# Patient Record
Sex: Female | Born: 2000 | Race: White | Hispanic: No | Marital: Single | Smoking: Never smoker
Health system: Southern US, Community
[De-identification: ages and names within clinical notes are randomized; demographics above are authoritative.]

## PROBLEM LIST (undated history)

## (undated) HISTORY — PX: TONSILLECTOMY: SUR1361

---
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
## 2020-09-09 ENCOUNTER — Emergency Department (HOSPITAL_BASED_OUTPATIENT_CLINIC_OR_DEPARTMENT_OTHER)
Admission: EM | Admit: 2020-09-09 | Discharge: 2020-09-09 | Disposition: A | Discharge status: Home / Self Care | Payer: Managed Care, Other (non HMO) | Attending: Emergency Medicine | Admitting: Emergency Medicine

## 2020-09-09 ENCOUNTER — Encounter (HOSPITAL_BASED_OUTPATIENT_CLINIC_OR_DEPARTMENT_OTHER): Payer: Self-pay | Admitting: Emergency Medicine

## 2020-09-09 ENCOUNTER — Other Ambulatory Visit: Payer: Self-pay

## 2020-09-09 DIAGNOSIS — R109 Unspecified abdominal pain: Secondary | ICD-10-CM | POA: Diagnosis not present

## 2020-09-09 DIAGNOSIS — N12 Tubulo-interstitial nephritis, not specified as acute or chronic: Secondary | ICD-10-CM | POA: Insufficient documentation

## 2020-09-09 DIAGNOSIS — R000 Tachycardia, unspecified: Secondary | ICD-10-CM | POA: Insufficient documentation

## 2020-09-09 DIAGNOSIS — R300 Dysuria: Secondary | ICD-10-CM | POA: Diagnosis present

## 2020-09-09 LAB — CBC WITH DIFFERENTIAL/PLATELET
Abs Immature Granulocytes: 0.04 10*3/uL (ref 0.00–0.07)
Basophils Absolute: 0 10*3/uL (ref 0.0–0.1)
Basophils Relative: 0 %
Eosinophils Absolute: 0 10*3/uL (ref 0.0–0.5)
Eosinophils Relative: 0 %
HCT: 39.5 % (ref 36.0–46.0)
Hemoglobin: 13.8 g/dL (ref 12.0–15.0)
Immature Granulocytes: 0 %
Lymphocytes Relative: 10 %
Lymphs Abs: 0.9 10*3/uL (ref 0.7–4.0)
MCH: 28.8 pg (ref 26.0–34.0)
MCHC: 34.9 g/dL (ref 30.0–36.0)
MCV: 82.5 fL (ref 80.0–100.0)
Monocytes Absolute: 0.8 10*3/uL (ref 0.1–1.0)
Monocytes Relative: 9 %
Neutro Abs: 7.2 10*3/uL (ref 1.7–7.7)
Neutrophils Relative %: 81 %
Platelets: 229 10*3/uL (ref 150–400)
RBC: 4.79 MIL/uL (ref 3.87–5.11)
RDW: 12.2 % (ref 11.5–15.5)
WBC: 9.1 10*3/uL (ref 4.0–10.5)
nRBC: 0 % (ref 0.0–0.2)

## 2020-09-09 LAB — COMPREHENSIVE METABOLIC PANEL
ALT: 10 U/L (ref 0–44)
AST: 17 U/L (ref 15–41)
Albumin: 4.4 g/dL (ref 3.5–5.0)
Alkaline Phosphatase: 54 U/L (ref 38–126)
Anion gap: 10 (ref 5–15)
BUN: 10 mg/dL (ref 6–20)
CO2: 22 mmol/L (ref 22–32)
Calcium: 9.2 mg/dL (ref 8.9–10.3)
Chloride: 101 mmol/L (ref 98–111)
Creatinine, Ser: 0.92 mg/dL (ref 0.44–1.00)
GFR, Estimated: >60 mL/min (ref >60)
Glucose, Bld: 108 mg/dL — ABNORMAL HIGH (ref 70–99)
Potassium: 3.2 mmol/L — ABNORMAL LOW (ref 3.5–5.1)
Sodium: 133 mmol/L — ABNORMAL LOW (ref 135–145)
Total Bilirubin: 2 mg/dL — ABNORMAL HIGH (ref 0.3–1.2)
Total Protein: 7.6 g/dL (ref 6.5–8.1)

## 2020-09-09 LAB — URINALYSIS, ROUTINE W REFLEX MICROSCOPIC
Bilirubin Urine: NEGATIVE
Glucose, UA: NEGATIVE mg/dL
Ketones, ur: NEGATIVE mg/dL
Nitrite: NEGATIVE
Protein, ur: NEGATIVE mg/dL
Specific Gravity, Urine: <1.005 — ABNORMAL LOW (ref 1.005–1.030)
pH: 6.5 (ref 5.0–8.0)

## 2020-09-09 LAB — URINALYSIS, MICROSCOPIC (REFLEX)

## 2020-09-09 LAB — PROTIME-INR
INR: 1.1 (ref 0.8–1.2)
Prothrombin Time: 14 seconds (ref 11.4–15.2)

## 2020-09-09 LAB — PREGNANCY, URINE: Preg Test, Ur: NEGATIVE

## 2020-09-09 LAB — APTT: aPTT: 29 seconds (ref 24–36)

## 2020-09-09 LAB — LACTIC ACID, PLASMA: Lactic Acid, Venous: 1 mmol/L (ref 0.5–1.9)

## 2020-09-09 MED ORDER — SODIUM CHLORIDE 0.9 % IV SOLN
1.0000 g | Freq: Once | INTRAVENOUS | Status: AC
  Administered 2020-09-09: 1 g via INTRAVENOUS
  Filled 2020-09-09: qty 10

## 2020-09-09 MED ORDER — KETOROLAC TROMETHAMINE 30 MG/ML IJ SOLN
30.0000 mg | Freq: Once | INTRAMUSCULAR | Status: AC
  Administered 2020-09-09: 30 mg via INTRAVENOUS
  Filled 2020-09-09: qty 1

## 2020-09-09 MED ORDER — ACETAMINOPHEN 500 MG PO TABS
1000.0000 mg | ORAL_TABLET | Freq: Once | ORAL | Status: AC
  Administered 2020-09-09: 1000 mg via ORAL
  Filled 2020-09-09: qty 2

## 2020-09-09 MED ORDER — LACTATED RINGERS IV BOLUS (SEPSIS)
1000.0000 mL | Freq: Once | INTRAVENOUS | Status: AC
  Administered 2020-09-09 (×2): 1000 mL via INTRAVENOUS

## 2020-09-09 MED ORDER — ONDANSETRON 4 MG PO TBDP: ORAL_TABLET | ORAL | 0 refills | Status: AC

## 2020-09-09 MED ORDER — POTASSIUM CHLORIDE CRYS ER 20 MEQ PO TBCR
40.0000 meq | EXTENDED_RELEASE_TABLET | Freq: Once | ORAL | Status: AC
  Administered 2020-09-09: 40 meq via ORAL
  Filled 2020-09-09: qty 2

## 2020-09-09 MED ORDER — CEPHALEXIN 500 MG PO CAPS: 500.0000 mg | ORAL_CAPSULE | Freq: Four times a day (QID) | ORAL | 0 refills | Status: AC

## 2020-09-09 NOTE — ED Provider Notes (Signed)
[facility] EMERGENCY DEPARTMENT Provider Note   CSN: [id number] Arrival date & time: 09/09/20  1336     History Chief Complaint  Patient presents with   Dysuria    Courtney Spears is a 20 y.o. female.  Courtney Spears is a 20 y.o. female who is otherwise healthy, presents to the emergency department for evaluation of dysuria, flank pain and fever.  Patient reports on Monday she started having persistent burning and discomfort with urination.  Patient reports dysuria has been getting progressively worse throughout the week.  She is also been having urinary frequency, feels like she needs to go every few minutes but goes very little.  She has had some suprapubic discomfort, primarily with urination.  On Thursday started experiencing some mild left flank pain that is nonradiating.  Pain is a constant dull ache.  She has had some associated nausea but no vomiting, mom reports she has had very poor appetite over the past few days.  Mom felt like she had a subjective fever which is what prompted their emergency department visit.  She has not been on any medications for the symptoms.  No prior history of urinary tract infections or kidney stones.  No vaginal bleeding, discharge or itching.  No other aggravating or alleviating factors.  The history is provided by the patient.      History reviewed. No pertinent past medical history.  There are no problems to display for this patient.   Past Surgical History:  Procedure Laterality Date   TONSILLECTOMY       OB History   No obstetric history on file.     No family history on file.  Social History   Tobacco Use   Smoking status: Never   Smokeless tobacco: Never  Substance Use Topics   Alcohol use: Never   Drug use: Never    Home Medications Prior to Admission medications   Medication Sig Start Date End Date Taking? Authorizing Provider  cephALEXin (KEFLEX) 500 MG capsule Take 1 capsule (500 mg total) by mouth 4  (four) times daily for 10 days. 09/09/20 09/19/20 Yes Dartha Lodge, PA-C  ondansetron (ZOFRAN ODT) 4 MG disintegrating tablet 4mg  ODT q4 hours prn nausea/vomit 09/09/20  Yes 09/11/20, PA-C    Allergies    Patient has no known allergies.  Review of Systems   Review of Systems  Constitutional:  Negative for chills and fever.  HENT: Negative.    Respiratory:  Negative for cough and shortness of breath.   Cardiovascular:  Negative for chest pain.  Gastrointestinal:  Positive for abdominal pain and nausea. Negative for diarrhea and vomiting.  Genitourinary:  Positive for dysuria, flank pain and frequency. Negative for hematuria, vaginal bleeding and vaginal discharge.  All other systems reviewed and are negative.  Physical Exam Updated Vital Signs BP 126/88   Pulse (!) 143   Temp (!) 103.3 F (39.6 C) (Oral)   Resp 20   Ht 5\' 6"  (1.676 m)   Wt 68 kg   LMP 09/07/2020   SpO2 97%   BMI 24.21 kg/m   Physical Exam Vitals and nursing note reviewed.  Constitutional:      General: She is not in acute distress.    Appearance: Normal appearance. She is well-developed and normal weight. She is not ill-appearing or diaphoretic.     Comments: Well-appearing and in no distress   HENT:     Head: Normocephalic and atraumatic.  Eyes:     General:  Right eye: No discharge.        Left eye: No discharge.  Cardiovascular:     Rate and Rhythm: Normal rate and regular rhythm.     Pulses: Normal pulses.     Heart sounds: Normal heart sounds.  Pulmonary:     Effort: Pulmonary effort is normal. No respiratory distress.     Breath sounds: Normal breath sounds. No wheezing or rales.     Comments: Respirations equal and unlabored, patient able to speak in full sentences, lungs clear to auscultation bilaterally  Abdominal:     General: Bowel sounds are normal. There is no distension.     Palpations: Abdomen is soft. There is no mass.     Tenderness: There is no abdominal tenderness.  There is left CVA tenderness. There is no guarding.     Comments: Abdomen soft, nondistended, there is some very mild suprapubic tenderness, no guarding or rebound tenderness, mild left-sided CVA tenderness.  Musculoskeletal:        General: No deformity.     Cervical back: Neck supple.  Skin:    General: Skin is warm and dry.     Capillary Refill: Capillary refill takes less than 2 seconds.  Neurological:     Mental Status: She is alert and oriented to person, place, and time.     Coordination: Coordination normal.     Comments: Speech is clear, able to follow commands Moves extremities without ataxia, coordination intact  Psychiatric:        Mood and Affect: Mood normal.        Behavior: Behavior normal.    ED Results / Procedures / Treatments   Labs (all labs ordered are listed, but only abnormal results are displayed) Labs Reviewed  URINALYSIS, ROUTINE W REFLEX MICROSCOPIC - Abnormal; Notable for the following components:      Result Value   APPearance CLOUDY (*)    Specific Gravity, Urine <1.005 (*)    Hgb urine dipstick MODERATE (*)    Leukocytes,Ua LARGE (*)    All other components within normal limits  COMPREHENSIVE METABOLIC PANEL - Abnormal; Notable for the following components:   Sodium 133 (*)    Potassium 3.2 (*)    Glucose, Bld 108 (*)    Total Bilirubin 2.0 (*)    All other components within normal limits  URINALYSIS, MICROSCOPIC (REFLEX) - Abnormal; Notable for the following components:   Bacteria, UA FEW (*)    All other components within normal limits  CULTURE, BLOOD (SINGLE)  URINE CULTURE  PREGNANCY, URINE  LACTIC ACID, PLASMA  CBC WITH DIFFERENTIAL/PLATELET  PROTIME-INR  APTT    EKG EKG Interpretation  Date/Time:  Saturday September 09 2020 14:27:45 EDT Ventricular Rate:  120 PR Interval:  144 QRS Duration: 91 QT Interval:  302 QTC Calculation: 427 R Axis:   61 Text Interpretation: Sinus tachycardia RSR' in V1 or V2, right VCD or RVH No  previous tracing Confirmed by Gwyneth Sprout ([id number]) on 09/09/2020 3:40:06 PM  Radiology No results found.  Procedures Procedures   Medications Ordered in ED Medications  acetaminophen (TYLENOL) tablet 1,000 mg (1,000 mg Oral Given 09/09/20 1357)  lactated ringers bolus 1,000 mL (0 mLs Intravenous Stopped 09/09/20 1537)  cefTRIAXone (ROCEPHIN) 1 g in sodium chloride 0.9 % 100 mL IVPB (0 g Intravenous Stopped 09/09/20 1537)  potassium chloride SA (KLOR-CON) CR tablet 40 mEq (40 mEq Oral Given 09/09/20 1535)  ketorolac (TORADOL) 30 MG/ML injection 30 mg (30 mg Intravenous Given 09/09/20 1557)  ED Course  I have reviewed the triage vital signs and the nursing notes.  Pertinent labs & imaging results that were available during my care of the patient were reviewed by me and considered in my medical decision making (see chart for details).    MDM Rules/Calculators/A&P                         20 year old female presents with fever, dysuria and left-sided flank pain.  On arrival patient febrile to 103.3 and tachycardic at 143, but looks well despite this, has some mild suprapubic tenderness and left flank tenderness.  Low suspicion for obstructing stone as patient looks very comfortable.  Patient does meet SIRS criteria although is well-appearing.  We will get labs including lactic acid and single blood culture as well as urinalysis and urine culture.  We will start patient on IV fluids and give IV Rocephin.  Tylenol given for fever.  I have independently ordered, reviewed and interpreted all labs and imaging: CBC: No leukocytosis, normal hemoglobin CMP: Mild hyponatremia 133, potassium of 3.2, no other electrolyte derangements, normal renal and liver function UA: Large leukocytes noted, few bacteria, 21-50 WBCs, white blood cell clumps present, given patient's symptoms and exam concern for pyelonephritis, urine culture sent. Lactic acid: WNL Blood culture: Pending  Despite patient's initial  fever and tachycardia with treatment with fluids and Tylenol fever and tachycardia have resolved patient is well-appearing, tolerating p.o.  Given dose of IV Rocephin, but feel she is appropriate for outpatient treatment for pyelonephritis with Keflex, Motrin and Tylenol for pain and fever, Zofran as needed for nausea.  Patient and mother expressed understanding and agreement.  Strict return precautions provided.  Discharged home in good condition.   Final Clinical Impression(s) / ED Diagnoses Final diagnoses:  Pyelonephritis    Rx / DC Orders ED Discharge Orders          Ordered    cephALEXin (KEFLEX) 500 MG capsule  4 times daily        09/09/20 1624    ondansetron (ZOFRAN ODT) 4 MG disintegrating tablet        09/09/20 1624             Jodi Geralds [employer] 09/09/20 1816    Gwyneth Sprout, MD 09/10/20 2221

## 2020-09-09 NOTE — Discharge Instructions (Addendum)
You have a urinary tract infection seems to be moving up towards her kidney causing left flank pain.  To treat this take Keflex 4 times daily for the next 10 days.  Is very important that you take entire course of antibiotics even if your symptoms are resolved.  To help treat pain and fever use Tylenol 1000 mg and ibuprofen 600 mg every 6 hours  For any nausea or vomiting use Zofran as needed.  Please make sure you are drinking plenty of fluids.  If you develop new or worsening symptoms such as persistent high fevers, vomiting, worsening pain return for reevaluation otherwise follow-up with your primary care doctor.

## 2020-09-09 NOTE — ED Triage Notes (Signed)
Pt reports dysuria since Monday; Lt flank pain since Thursday

## 2020-09-12 LAB — URINE CULTURE: Culture: 40000 — AB

## 2020-09-13 ENCOUNTER — Telehealth: Payer: Self-pay | Admitting: *Deleted

## 2020-09-13 NOTE — Telephone Encounter (Signed)
Post ED Visit - Positive Culture Follow-up  Culture report reviewed by antimicrobial stewardship pharmacist: [facility] [] Nathan Batchelder, Pharm.D. [] Jeremy Frens, Pharm.D., BCPS AQ-ID [] Mike Maccia, Pharm.D., BCPS [] Elizabeth Martin, Pharm.D., BCPS [] Minh Pham, Pharm.D., BCPS, AAHIVP [] Michelle Turner, Pharm.D., BCPS, AAHIVP [] Rachel Rumbarger, PharmD, BCPS [] Thuy Dang, PharmD, BCPS [] Alison Masters, PharmD, BCPS [] Erin Deja, PharmD [] Catherine Pierce, PharmD, BCPS [] Benjamin Mancheril, PharmD  [facility] [] Michelle Bell, PharmD [] Jigna Gadhia, PharmD [] Nikola Glogovac, PharmD [] Terri Green, Rph [] Rachel (Ellen) Jackson, PharmD [] Justin Legge, PharmD [] Michelle Lilliston, PharmD [] Anh Pham, PharmD [] Leann Poindexter, PharmD [] Christine Shade, PharmD [] Mary Swayne, PharmD [] Erin Williamson, PharmD [] Drew Wofford, PharmD   Positive urine culture Treated with Cephalexin, organism sensitive to the same and no further patient follow-up is required at this time.  Courtney Spears 09/13/2020, 10:14 AM   

## 2020-09-14 LAB — CULTURE, BLOOD (SINGLE)
Culture: NO GROWTH
Special Requests: ADEQUATE
